# Patient Record
Sex: Female | Born: 1965 | Race: Black or African American | Hispanic: No | Marital: Single | Smoking: Never smoker
Health system: Southern US, Community
[De-identification: ages and names within clinical notes are randomized; demographics above are authoritative.]

---

## 2013-09-04 ENCOUNTER — Emergency Department (HOSPITAL_COMMUNITY)

## 2013-09-04 ENCOUNTER — Encounter (HOSPITAL_COMMUNITY): Payer: Self-pay | Admitting: Emergency Medicine

## 2013-09-04 ENCOUNTER — Emergency Department (HOSPITAL_COMMUNITY)
Admission: EM | Admit: 2013-09-04 | Discharge: 2013-09-04 | Disposition: A | Discharge status: Home / Self Care | Attending: Emergency Medicine | Admitting: Emergency Medicine

## 2013-09-04 DIAGNOSIS — S8990XA Unspecified injury of unspecified lower leg, initial encounter: Secondary | ICD-10-CM | POA: Insufficient documentation

## 2013-09-04 DIAGNOSIS — M25469 Effusion, unspecified knee: Secondary | ICD-10-CM

## 2013-09-04 DIAGNOSIS — S99919A Unspecified injury of unspecified ankle, initial encounter: Principal | ICD-10-CM

## 2013-09-04 DIAGNOSIS — Y929 Unspecified place or not applicable: Secondary | ICD-10-CM | POA: Insufficient documentation

## 2013-09-04 DIAGNOSIS — S99929A Unspecified injury of unspecified foot, initial encounter: Principal | ICD-10-CM

## 2013-09-04 DIAGNOSIS — X500XXA Overexertion from strenuous movement or load, initial encounter: Secondary | ICD-10-CM | POA: Insufficient documentation

## 2013-09-04 DIAGNOSIS — Y93B9 Activity, other involving muscle strengthening exercises: Secondary | ICD-10-CM | POA: Insufficient documentation

## 2013-09-04 DIAGNOSIS — M25562 Pain in left knee: Secondary | ICD-10-CM

## 2013-09-04 DIAGNOSIS — Z791 Long term (current) use of non-steroidal anti-inflammatories (NSAID): Secondary | ICD-10-CM | POA: Insufficient documentation

## 2013-09-04 MED ORDER — NAPROXEN 500 MG PO TABS: 500.0000 mg | ORAL_TABLET | Freq: Two times a day (BID) | ORAL | Status: AC

## 2013-09-04 MED ORDER — NAPROXEN 500 MG PO TABS
500.0000 mg | ORAL_TABLET | Freq: Once | ORAL | Status: AC
  Administered 2013-09-04: 500 mg via ORAL
  Filled 2013-09-04: qty 1

## 2013-09-04 MED ORDER — TRAMADOL HCL 50 MG PO TABS: 50.0000 mg | ORAL_TABLET | Freq: Four times a day (QID) | ORAL | Status: AC | PRN

## 2013-09-04 NOTE — Discharge Instructions (Signed)
Call and make appointment with orthopedics for follow up evaluation of Knee pain. Take pain medications as directed. Do not drive while taking Tramadol. Rest, Ice, Elevation of affected joint. Return to ED should your have worsening symptoms.

## 2013-09-04 NOTE — ED Provider Notes (Signed)
CSN: [id number]     Arrival date & time 09/04/13  1356 History  This chart was scribed for non-physician practitioner Rudene Anda, PA-C, working with Suzi Roots, MD, by Yevette Edwards, ED Scribe. This patient was seen in room WTR5/WTR5 and the patient's care was started at 2:56 PM.  First MD Initiated Contact with Patient 09/04/13 1427     Chief Complaint  Patient presents with  . Knee Pain   The history is provided by the patient. No language interpreter was used.   HPI Comments: Ebony Wilcox is a 48 y.o. female who presents to the Emergency Department complaining of acute left knee pain which began this morning as she was laterally lunging. The pt reports she heard a "pop" but did not experience immediate pain or difficulty ambulating.  A few hours later, the pain began and increased to the point that she now has pain with ambulation. Pain worse with Knee flexion and Weight-bearing. She characterizes the pain as non-radiating and "dull," and she rates the pain as 2/10 currently.  She denies a fever or chills.   History reviewed. No pertinent past medical history. History reviewed. No pertinent past surgical history. No family history on file. History  Substance Use Topics  . Smoking status: Never Smoker   . Smokeless tobacco: Not on file  . Alcohol Use: No   No OB history provided.  Review of Systems  Constitutional: Negative for fever and chills.  Musculoskeletal: Positive for arthralgias, gait problem and joint swelling. Negative for myalgias.  Skin: Negative for wound.  All other systems reviewed and are negative.   Allergies  Review of patient's allergies indicates no known allergies.  Home Medications   Current Outpatient Rx  Name  Route  Sig  Dispense  Refill  . naproxen (NAPROSYN) 500 MG tablet   Oral   Take 1 tablet (500 mg total) by mouth 2 (two) times daily with a meal.   30 tablet   0   . traMADol (ULTRAM) 50 MG tablet   Oral   Take 1 tablet  (50 mg total) by mouth every 6 (six) hours as needed.   15 tablet   0     Triage Vitals: BP 134/95  Temp(Src) 98.2 F (36.8 C) (Oral)  Resp 16  SpO2 100%  LMP 08/06/2013  Physical Exam  Nursing note and vitals reviewed. Constitutional: She is oriented to person, place, and time. She appears well-developed and well-nourished. No distress.  HENT:  Head: Normocephalic and atraumatic.  Eyes: EOM are normal.  Neck: Neck supple. No tracheal deviation present.  Cardiovascular: Normal rate and regular rhythm.   Pulmonary/Chest: Effort normal and breath sounds normal. No respiratory distress.  Musculoskeletal: Normal range of motion.  LEFT KNEE: Negative anterior drawer test Negative Lachman test Tenderness over lateral joint line.  Pain with movement of patella.  Pain with active and passive flexion of knee.  No pain with resisted flexion or extension of the knee . No warmth. No erythema. Mild swelling.  Equal strength with knee flexion/extension bilaterally.     Neurological: She is alert and oriented to person, place, and time.  Skin: Skin is warm and dry.  Psychiatric: She has a normal mood and affect. Her behavior is normal.    ED Course  Procedures (including critical care time)  DIAGNOSTIC STUDIES: Oxygen Saturation is 100% on room air, normal by my interpretation.    COORDINATION OF CARE:  3:06 PM- Discussed treatment plan with patient, which includes  icing and a follow-up with an orthopedic, and the patient agreed to the plan.   Labs Review Labs Reviewed - No data to display Imaging Review Dg Knee Complete 4 Views Left  09/04/2013   CLINICAL DATA:  Pain and swelling  EXAM: LEFT KNEE - COMPLETE 4+ VIEW  COMPARISON:  None.  FINDINGS: No fracture or dislocation. There is a moderate joint effusion. Joint spaces appear intact. No erosive change.  IMPRESSION: Joint effusion.  No fracture or joint space narrowing.   Electronically Signed   By: Bretta BangWilliam  Woodruff M.D.    On: 09/04/2013 14:49    EKG Interpretation   None       MDM   1. Knee pain, left   2. Joint effusion of knee    Plain films show moderate joint effusion. No fracture, dislocation, or joint space narrowing.  Plan to provide patient with crutches, treat her pain, and have her follow up with orthopedics w/in 1 week.  Patient agrees with plan. Discharged in good condition.  Meds given in ED:  Medications  naproxen (NAPROSYN) tablet 500 mg (500 mg Oral Given 09/04/13 1520)    Discharge Medication List as of 09/04/2013  3:20 PM    START taking these medications   Details  naproxen (NAPROSYN) 500 MG tablet Take 1 tablet (500 mg total) by mouth 2 (two) times daily with a meal., Starting 09/04/2013, Until Discontinued, Print    traMADol (ULTRAM) 50 MG tablet Take 1 tablet (50 mg total) by mouth every 6 (six) hours as needed., Starting 09/04/2013, Until Discontinued, Print         I personally performed the services described in this documentation, which was scribed in my presence. The recorded information has been reviewed and is accurate.    Rudene AndaJacob Gray Jiana Lemaire, PA-C 09/06/13 2306

## 2013-09-04 NOTE — ED Notes (Signed)
Pt waiting on ortho tech for knee immobilizer and crutches.

## 2013-09-04 NOTE — ED Notes (Signed)
Patient states that she was exercising this am and heard her left knee pop. The patient reports that she is unable to walk on it know

## 2013-09-07 NOTE — ED Provider Notes (Signed)
Medical screening examination/treatment/procedure(s) were performed by non-physician practitioner and as supervising physician I was immediately available for consultation/collaboration.  EKG Interpretation   None         Suzi RootsKevin E Jahlisa Rossitto, MD 09/07/13 2226

## 2015-03-19 IMAGING — CR DG KNEE COMPLETE 4+V*L*
4 series · 4 of 4 positions shown · non-contrast
Comparison: None.

CLINICAL DATA: Pain and swelling

EXAM:
LEFT KNEE - COMPLETE 4+ VIEW

[t knee ap left]
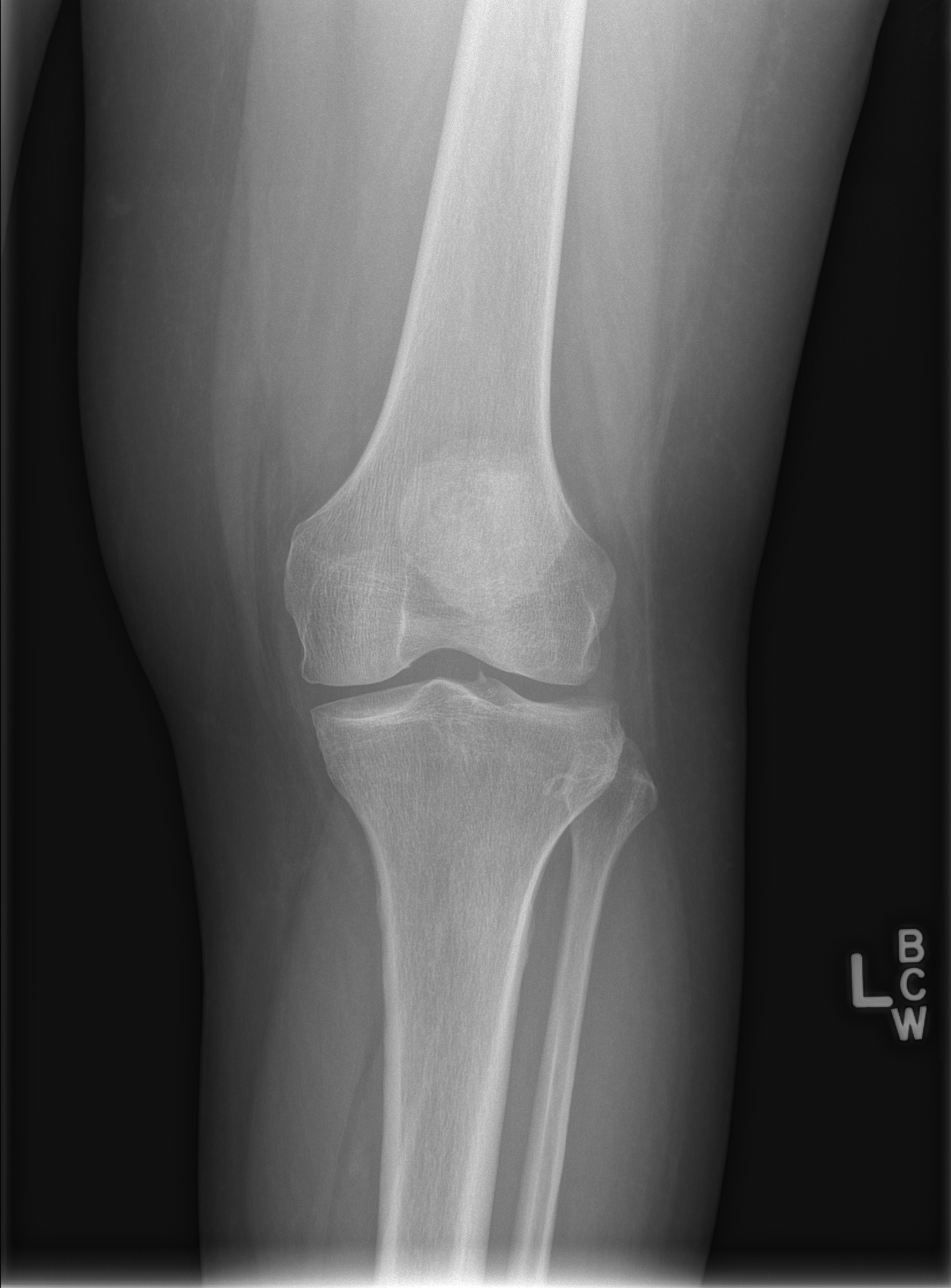

[t knee obl left (1 of 2)]
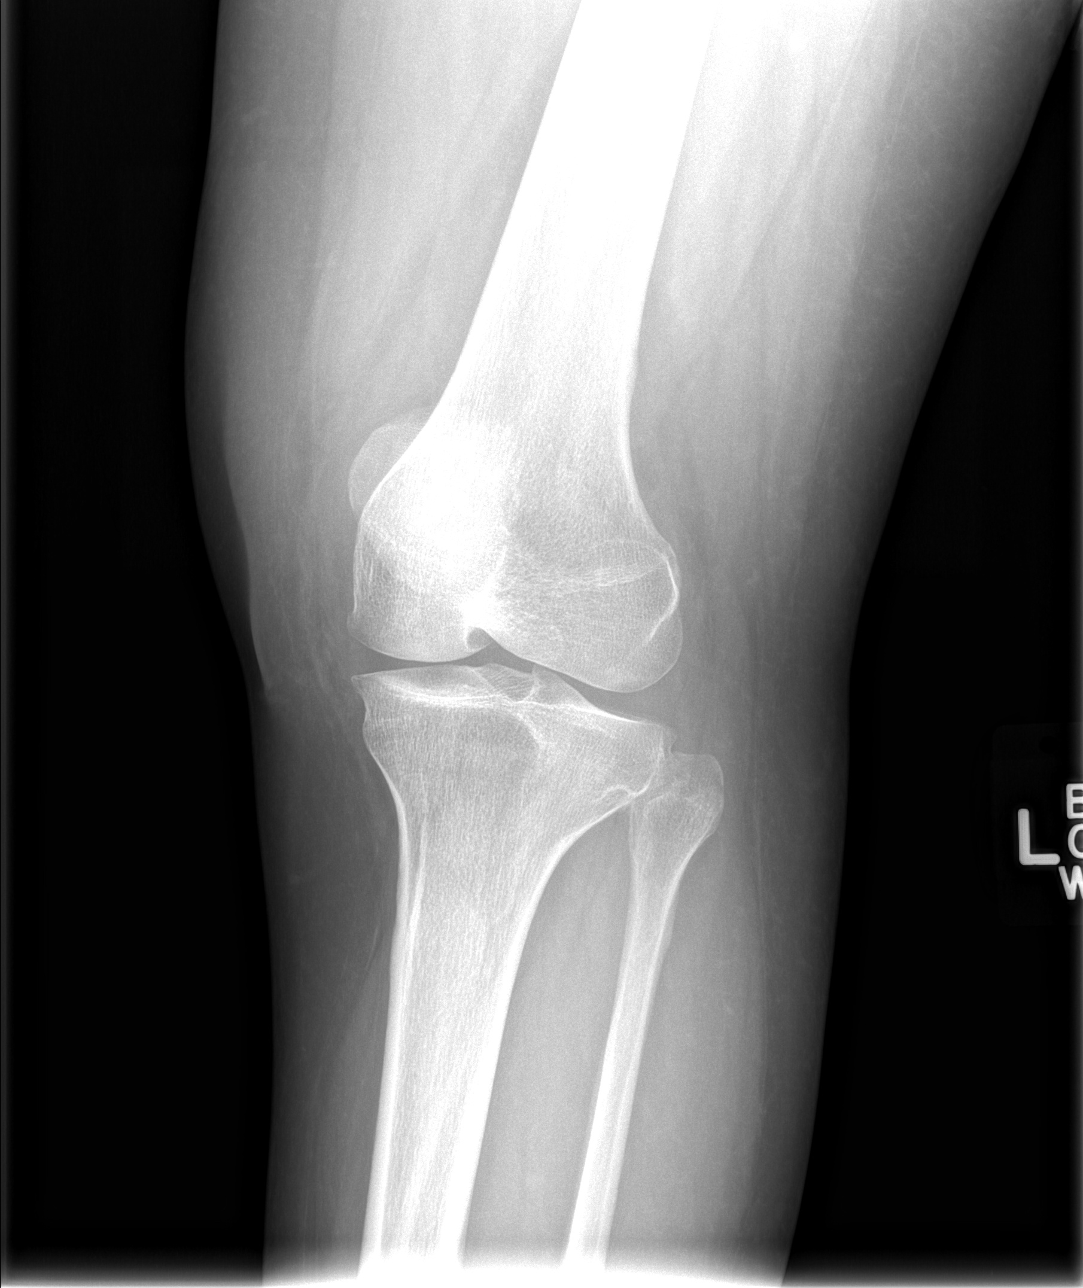

[t knee obl left (2 of 2)]
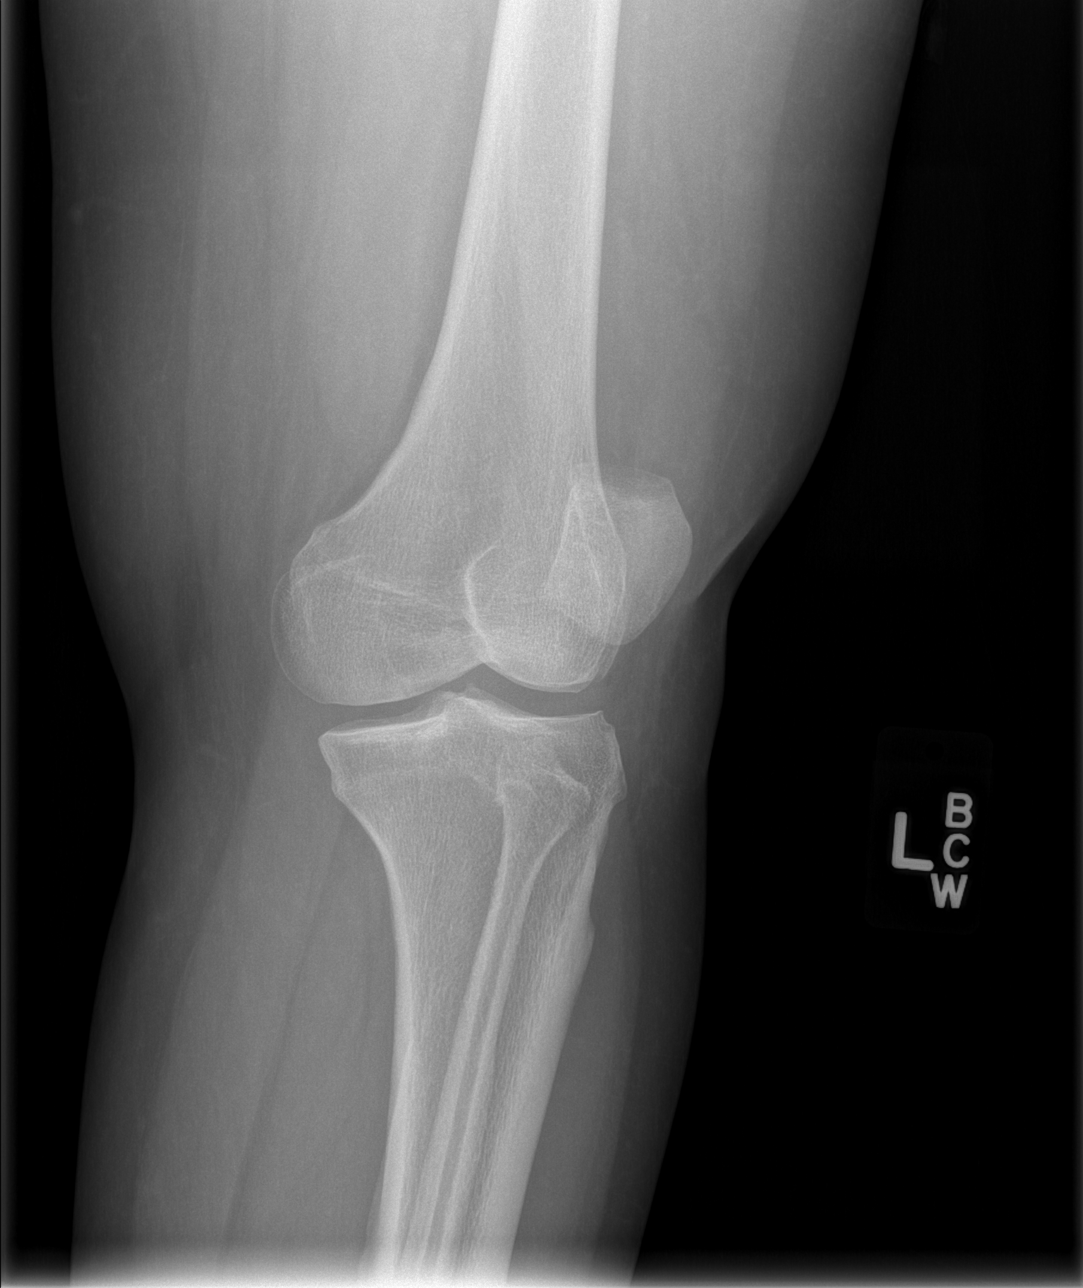

[t knee lat left]
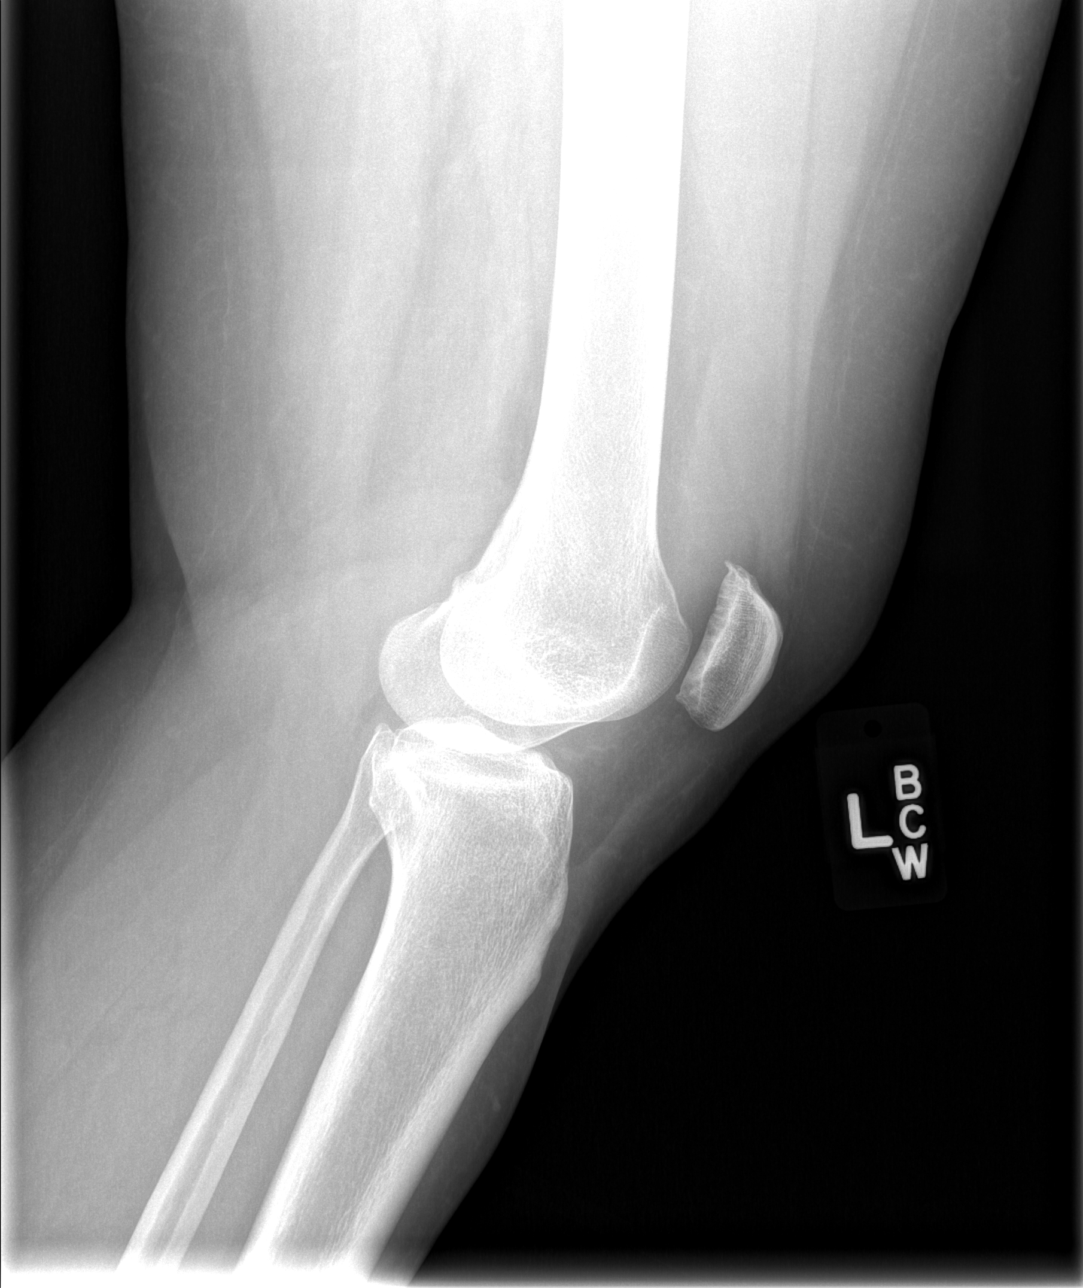

[4 of 4 positions shown; findings below may reference images not displayed]

FINDINGS: No fracture or dislocation. There is a moderate joint effusion.
Joint spaces appear intact. No erosive change.
IMPRESSION: Joint effusion.  No fracture or joint space narrowing.
